# Patient Record
Sex: Female | Born: 1950 | Race: White | Hispanic: No | Marital: Single | Smoking: Never smoker
Health system: Southern US, Community
[De-identification: ages and names within clinical notes are randomized; demographics above are authoritative.]

## PROBLEM LIST (undated history)

## (undated) DIAGNOSIS — N200 Calculus of kidney: Secondary | ICD-10-CM

## (undated) HISTORY — PX: FOOT SURGERY: SHX648

---

## 2017-01-26 ENCOUNTER — Emergency Department (HOSPITAL_BASED_OUTPATIENT_CLINIC_OR_DEPARTMENT_OTHER)
Admission: EM | Admit: 2017-01-26 | Discharge: 2017-01-26 | Disposition: A | Discharge status: Home / Self Care | Payer: BLUE CROSS/BLUE SHIELD | Attending: Emergency Medicine | Admitting: Emergency Medicine

## 2017-01-26 ENCOUNTER — Encounter (HOSPITAL_BASED_OUTPATIENT_CLINIC_OR_DEPARTMENT_OTHER): Payer: Self-pay | Admitting: *Deleted

## 2017-01-26 ENCOUNTER — Emergency Department (HOSPITAL_BASED_OUTPATIENT_CLINIC_OR_DEPARTMENT_OTHER): Payer: BLUE CROSS/BLUE SHIELD

## 2017-01-26 DIAGNOSIS — M545 Low back pain, unspecified: Secondary | ICD-10-CM

## 2017-01-26 DIAGNOSIS — M791 Myalgia: Secondary | ICD-10-CM | POA: Diagnosis not present

## 2017-01-26 HISTORY — DX: Calculus of kidney: N20.0

## 2017-01-26 LAB — URINALYSIS, ROUTINE W REFLEX MICROSCOPIC
Bilirubin Urine: NEGATIVE
Glucose, UA: NEGATIVE mg/dL
Hgb urine dipstick: NEGATIVE
Ketones, ur: NEGATIVE mg/dL
Leukocytes, UA: NEGATIVE
Nitrite: NEGATIVE
Protein, ur: NEGATIVE mg/dL
Specific Gravity, Urine: 1.03 (ref 1.005–1.030)
pH: 6 (ref 5.0–8.0)

## 2017-01-26 MED ORDER — TRAMADOL HCL 50 MG PO TABS: 50.0000 mg | ORAL_TABLET | Freq: Four times a day (QID) | ORAL | 0 refills | Status: AC | PRN

## 2017-01-26 MED ORDER — CYCLOBENZAPRINE HCL 5 MG PO TABS: 5.0000 mg | ORAL_TABLET | Freq: Two times a day (BID) | ORAL | 0 refills | Status: AC | PRN

## 2017-01-26 NOTE — ED Triage Notes (Signed)
Pt amb to triage with slow steady gait in nad. Pt reports right sided low back pain x 2 months, no worse today, "I just can't take it anymore." pt has hx of renal stones, states this is not her renal stone pain.

## 2017-01-26 NOTE — ED Notes (Signed)
Patient transported to X-ray 

## 2017-01-26 NOTE — Discharge Instructions (Signed)
Medications: Flexeril, tramadol  Treatment: Take 1-2 Flexeril twice daily as needed for muscle pain and spasms. Take tramadol every 6 hours as needed for your pain. Use ice and moist heat 3-4 times daily alternating 20 minutes on, 20 minutes off. Attempt the back exercises as tolerated.  Follow-up: Please follow-up with Dr. Pearletha ForgeHudnall, a sports medicine doctor, for further evaluation and treatment of your back pain. Please return to emergency department if you develop any new or worsening symptoms including loss of bowel or bladder control, numbness in your groin area, or inability to walk.

## 2017-01-26 NOTE — ED Provider Notes (Signed)
[facility] Provider Note   CSN: [id number] Arrival date & time: 01/26/17  1531  By signing my name below, I, Ryan Long, attest that this documentation has been prepared under the direction and in the presence of Krishna Dancel M. Eaden Hettinger, PA-C. Electronically Signed: Garen Lah, Scribe. 01/26/2017. 6:34 PM.   History   Chief Complaint Chief Complaint  Patient presents with  . Back Pain    The history is provided by the patient. No language interpreter was used.    HPI Comments:  Nancy Dennis is a 66 y.o. female with a PMHx of Renal Calculi, who presents to the Emergency Department complaining of constant, severe, right-sided lower back pain onset two months ago. She qualifies the pain as "gnawing". She reports having foot surgery ten weeks ago that she normally wears a boot for, but she stopped wearing it two weeks ago. She notes the pain is centralized to the middle of her lower back shoots to her right side. She also states the pain exacerbates with movement and standing where as sitting slightly alleviates the pain. She has a h/o renal calculi with the last episode occurring one month ago but notes this pain is dissimilar to her pain at that time. No h/o CA. No h/o IV drug abuse. Pt denies fever, CP, numbness, bladder/bowel incontinence, urgency, frequency, hematuria, dysuria, difficulty urinating, neck pain, and any other associated symptoms at this time.    Past Medical History:  Diagnosis Date  . Kidney stone     There are no active problems to display for this patient.   Past Surgical History:  Procedure Laterality Date  . FOOT SURGERY      OB History    No data available       Home Medications    Prior to Admission medications   Medication Sig Start Date End Date Taking? Authorizing Provider  cyclobenzaprine (FLEXERIL) 5 MG tablet Take 1 tablet (5 mg total) by mouth 2 (two) times daily as needed for muscle spasms. 01/26/17   Emi Holes, PA-C  traMADol  (ULTRAM) 50 MG tablet Take 1 tablet (50 mg total) by mouth every 6 (six) hours as needed. 01/26/17   Emi Holes, PA-C    Family History History reviewed. No pertinent family history.  Social History Social History  Substance Use Topics  . Smoking status: Never Smoker  . Smokeless tobacco: Never Used  . Alcohol use Not on file     Allergies   Patient has no known allergies.   Review of Systems Review of Systems  Constitutional: Negative for chills and fever.  HENT: Negative for facial swelling and sore throat.   Respiratory: Negative for shortness of breath.   Cardiovascular: Negative for chest pain.  Gastrointestinal: Negative for abdominal pain, nausea and vomiting.  Genitourinary: Negative for difficulty urinating, dysuria, frequency, hematuria and urgency.       - bladder/bowel incontinence  Musculoskeletal: Positive for back pain and myalgias. Negative for neck pain.  Skin: Negative for rash and wound.  Neurological: Negative for numbness and headaches.  Psychiatric/Behavioral: The patient is not nervous/anxious.      Physical Exam Updated Vital Signs BP 155/92 (BP Location: Left Arm)   Pulse 74   Resp 20   LMP  (LMP Unknown) Comment: postmenopausal  SpO2 99%   Physical Exam  Constitutional: She appears well-developed and well-nourished. No distress.  HENT:  Head: Normocephalic and atraumatic.  Mouth/Throat: Oropharynx is clear and moist. No oropharyngeal exudate.  Eyes: Conjunctivae are  normal. Pupils are equal, round, and reactive to light. Right eye exhibits no discharge. Left eye exhibits no discharge. No scleral icterus.  Neck: Normal range of motion. Neck supple. No thyromegaly present.  Cardiovascular: Normal rate, regular rhythm, normal heart sounds and intact distal pulses.  Exam reveals no gallop and no friction rub.   No murmur heard. Pulmonary/Chest: Effort normal and breath sounds normal. No stridor. No respiratory distress. She has no wheezes.  She has no rales.  Abdominal: Soft. Bowel sounds are normal. She exhibits no distension. There is no tenderness. There is no rebound and no guarding.  Musculoskeletal: She exhibits no edema.  Right lumbar and SI joint tenderness. No midline cervical or thoracic tenderness.   Lymphadenopathy:    She has no cervical adenopathy.  Neurological: She is alert. Coordination normal.  5/5 strength to the lower extremities. Normal sensation.  Pt can heel raise and toe raise without difficulty.   Skin: Skin is warm and dry. No rash noted. She is not diaphoretic. No pallor.  Psychiatric: She has a normal mood and affect.  Nursing note and vitals reviewed.   ED Treatments / Results  DIAGNOSTIC STUDIES:  COORDINATION OF CARE:  6:30 PM Discussed treatment plan with pt at bedside including a back XR and [place] A/P and pt agreed to plan.  Labs (all labs ordered are listed, but only abnormal results are displayed) Labs Reviewed  URINALYSIS, ROUTINE W REFLEX MICROSCOPIC - Abnormal; Notable for the following:       Result Value   APPearance CLOUDY (*)    All other components within normal limits    EKG  EKG Interpretation None       Radiology Dg Lumbar Spine Complete  Result Date: 01/26/2017 CLINICAL DATA:  Right low back pain for 1 month radiating to the right hip. Fall 1 month prior. EXAM: LUMBAR SPINE - COMPLETE 4+ VIEW COMPARISON:  None. FINDINGS: This report assumes 5 non rib-bearing lumbar vertebrae. There is moderate dextrocurvature of the lumbar spine. There is diffuse osteopenia. Lumbar vertebral body heights are preserved, with no fracture. Moderate to severe degenerative disc disease throughout the visualized thoracolumbar spine, most prominent at L2-3 and L3-4. Minimal 3 mm anterolisthesis at L5-S1. There is 6 mm right lateral subluxation of L3 relative to L2 on the frontal view. Mild facet arthropathy bilaterally in the lower lumbar spine. No aggressive appearing focal osseous lesions.  There are clustered 7 mm and 5 mm stones in the lower left kidney. Abdominal aortic atherosclerosis. IMPRESSION: 1. No lumbar spine fracture. 2. Moderate dextrocurvature of the lumbar spine. 3. Moderate to severe degenerative disc disease throughout the visualized thoracolumbar spine. 4. Minimal 3 mm anterolisthesis at L5-S1 . 5. Mild facet arthropathy bilaterally in the lower lumbar spine. 6. Left lower nephrolithiasis. 7. Aortic atherosclerosis. Electronically Signed   By: Delbert Phenix M.D.   On: 01/26/2017 19:59   Dg Hip Unilat W Or Wo Pelvis 2-3 Views Right  Result Date: 01/26/2017 CLINICAL DATA:  Chronic right hip pain, radiating to the lumbar spine. Fell 1 month ago. Initial encounter. EXAM: DG HIP (WITH OR WITHOUT PELVIS) 2-3V RIGHT COMPARISON:  None. FINDINGS: There is no evidence of fracture or dislocation. Both femoral heads are seated normally within their respective acetabula. The proximal right femur appears intact. Mild degenerative change is noted at the lower lumbar spine. The sacroiliac joints are unremarkable in appearance. The visualized bowel gas pattern is grossly unremarkable in appearance. IMPRESSION: No evidence of fracture or dislocation. Electronically Signed  By: Roanna RaiderJeffery  Chang M.D.   On: 01/26/2017 19:57    Procedures Procedures (including critical care time)  Medications Ordered in ED Medications - No data to display   Initial Impression / Assessment and Plan / ED Course  I have reviewed the triage vital signs and the nursing notes.  Pertinent labs & imaging results that were available during my care of the patient were reviewed by me and considered in my medical decision making (see chart for details).     Patient with back pain. X-ray of the lumbar spine shows no fracture; moderate dextro curvature; moderate to severe DDD throughout visualized thoracolumbar spine; minimal 3 mm anterolisthesis at L5-S1; mild facet arthropathy bilaterally in the lower lumbar spine;  left lower nephrolithiasis; aortic atherosclerosis. R Hip x-ray negative. No neurological deficits and normal neuro exam.  Patient is ambulatory.  No loss of bowel or bladder control.  No concern for cauda equina.  No fever, night sweats, weight loss, h/o cancer, IVDA, no recent procedure to back. No urinary symptoms suggestive of UTI.  Discharge patient home with Flexeril and tramadol. Follow up to orthopedics. Supportive care and return precaution discussed. Patient understands and agrees with plan. Patient vitals stable throughout ED course and discharged in satisfactory condition. I discussed patient case with Dr. Fredderick PhenixBelfi who guided the patient's management and agrees with plan.   Final Clinical Impressions(s) / ED Diagnoses   Final diagnoses:  Acute right-sided low back pain without sciatica    New Prescriptions Discharge Medication List as of 01/26/2017  8:51 PM    START taking these medications   Details  cyclobenzaprine (FLEXERIL) 5 MG tablet Take 1 tablet (5 mg total) by mouth 2 (two) times daily as needed for muscle spasms., Starting Thu 01/26/2017, Print    traMADol (ULTRAM) 50 MG tablet Take 1 tablet (50 mg total) by mouth every 6 (six) hours as needed., Starting Thu 01/26/2017, Print       I personally performed the services described in this documentation, which was scribed in my presence. The recorded information has been reviewed and is accurate.    Emi Holeslexandra M Antoin Dargis, PA-C 01/27/17 1556    Rolan BuccoMelanie Belfi, MD 01/27/17 [id number]

## 2017-02-02 ENCOUNTER — Ambulatory Visit: Payer: BLUE CROSS/BLUE SHIELD | Admitting: Family Medicine

## 2017-02-03 ENCOUNTER — Encounter: Payer: Self-pay | Admitting: Family Medicine

## 2017-02-03 ENCOUNTER — Ambulatory Visit (INDEPENDENT_AMBULATORY_CARE_PROVIDER_SITE_OTHER): Payer: BLUE CROSS/BLUE SHIELD | Admitting: Family Medicine

## 2017-02-03 DIAGNOSIS — G8929 Other chronic pain: Secondary | ICD-10-CM

## 2017-02-03 DIAGNOSIS — M545 Low back pain: Secondary | ICD-10-CM

## 2017-02-03 MED ORDER — MELOXICAM 15 MG PO TABS: 15.0000 mg | ORAL_TABLET | Freq: Every day | ORAL | 2 refills | Status: AC

## 2017-02-03 NOTE — Patient Instructions (Signed)
You have facet arthropathy, spinal stenosis Ok to take tylenol for baseline pain relief (1-2 extra strength tabs 3x/day) A prednisone dose pack is a consideration. Start meloxicam 15mg  daily with food for pain and inflammation. Tramadol as needed for severe pain (no driving on this medicine). Stay as active as possible. Physical therapy has been shown to be helpful as well - do this and home exercises - avoid extension based exercises. Strengthening of low back muscles, abdominal musculature are key for long term pain relief. If not improving, will consider further imaging (MRI). Follow up with me in 1 month for reevaluation.

## 2017-02-08 ENCOUNTER — Telehealth: Payer: Self-pay | Admitting: Family Medicine

## 2017-02-08 DIAGNOSIS — M545 Low back pain, unspecified: Secondary | ICD-10-CM | POA: Insufficient documentation

## 2017-02-08 NOTE — Assessment & Plan Note (Signed)
independently reviewed radiographs.  Noted multilevel severe degenerative disc disease along with facet arthropathy.  Her level of pain that is worse with prolonged standing and extension suggest spinal stenosis and facet arthropathy are causes of her pain. Discussed options - she will start meloxicam with tylenol, tramadol as needed.  Start physical therapy for flexion based program.  Consider MRI if not improving.  F/u in 1 month.

## 2017-02-08 NOTE — Progress Notes (Signed)
PCP: Dr. Saverio DankerMichelle Dennis  Subjective:   HPI: Patient is a 66 y.o. female here for low back pain.  Patient reports she's had 2 months of low back pain on right side.   Pain level 3/10 level. No acute injury or truama. Gets up to 10/10 with prolonged standing. Worse when wearing boot she had for bunionectomy in November. Tried flexeril, tramadol. Worse with extension also. No radiation of pain. No numbness or tingling. No bowel/bladder dysfunction  Past Medical History:  Diagnosis Date  . Kidney stone     Current Outpatient Prescriptions on File Prior to Visit  Medication Sig Dispense Refill  . cyclobenzaprine (FLEXERIL) 5 MG tablet Take 1 tablet (5 mg total) by mouth 2 (two) times daily as needed for muscle spasms. 20 tablet 0  . traMADol (ULTRAM) 50 MG tablet Take 1 tablet (50 mg total) by mouth every 6 (six) hours as needed. 15 tablet 0   No current facility-administered medications on file prior to visit.     Past Surgical History:  Procedure Laterality Date  . FOOT SURGERY      No Known Allergies  Social History   Social History  . Marital status: Single    Spouse name: N/A  . Number of children: N/A  . Years of education: N/A   Occupational History  . Not on file.   Social History Main Topics  . Smoking status: Never Smoker  . Smokeless tobacco: Never Used  . Alcohol use Not on file  . Drug use: Unknown  . Sexual activity: Not on file   Other Topics Concern  . Not on file   Social History Narrative  . No narrative on file    No family history on file.  BP (!) 148/95   Pulse 84   Ht 5\' 5"  (1.651 m)   Wt 195 lb (88.5 kg)   LMP  (LMP Unknown) Comment: postmenopausal  BMI 32.45 kg/m   Review of Systems: See HPI above.     Objective:  Physical Exam:  Gen: NAD, comfortable in exam room  Back: No gross deformity, scoliosis. No TTP.  No midline or bony TTP. FROM with pain on extension. Strength LEs 5/5 all muscle groups.   2+ MSRs in  patellar and achilles tendons, equal bilaterally. Negative SLRs. Sensation intact to light touch bilaterally. Negative logroll bilateral hips Negative fabers and piriformis stretches.   Assessment & Plan:  1. Low back pain - independently reviewed radiographs.  Noted multilevel severe degenerative disc disease along with facet arthropathy.  Her level of pain that is worse with prolonged standing and extension suggest spinal stenosis and facet arthropathy are causes of her pain. Discussed options - she will start meloxicam with tylenol, tramadol as needed.  Start physical therapy for flexion based program.  Consider MRI if not improving.  F/u in 1 month.

## 2017-02-09 NOTE — Addendum Note (Signed)
Addended by: Kathi SimpersWISE, Athalee Esterline F on: 02/09/2017 10:47 AM   Modules accepted: Orders

## 2017-02-09 NOTE — Telephone Encounter (Signed)
Order faxed.

## 2017-02-09 NOTE — Telephone Encounter (Signed)
She called again wanting to know when order for PT would be sent, thanks

## 2017-02-17 ENCOUNTER — Telehealth: Payer: Self-pay | Admitting: Family Medicine

## 2017-02-17 NOTE — Telephone Encounter (Signed)
Received long term disability paperwork for patient.  We had not discussed impairment, disability related to her back.  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  Given issues with her back, started physical therapy recently, I believe she should be on restrictions from lifting, pushing, pulling, limited standing/walking for next month with plans to see her back at that time.

## 2017-02-20 ENCOUNTER — Encounter: Payer: Self-pay | Admitting: *Deleted

## 2018-10-06 IMAGING — CR DG LUMBAR SPINE COMPLETE 4+V
5 series · 5 of 5 positions shown · non-contrast
Comparison: None.

CLINICAL DATA: Right low back pain for 1 month radiating to the
right hip. Fall 1 month prior.

EXAM:
LUMBAR SPINE - COMPLETE 4+ VIEW

[t l-spine a.p.]
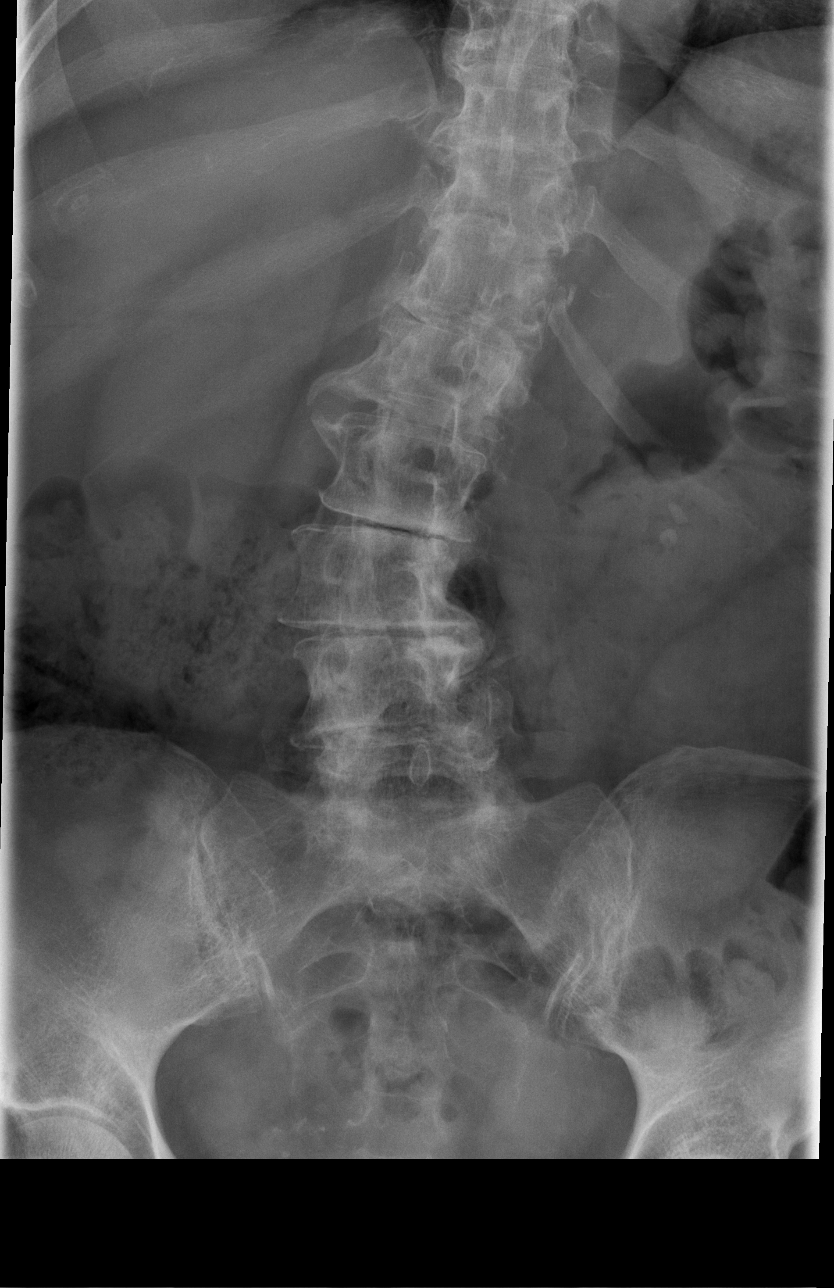

[t l-spine oblique exposure (1 of 2)]
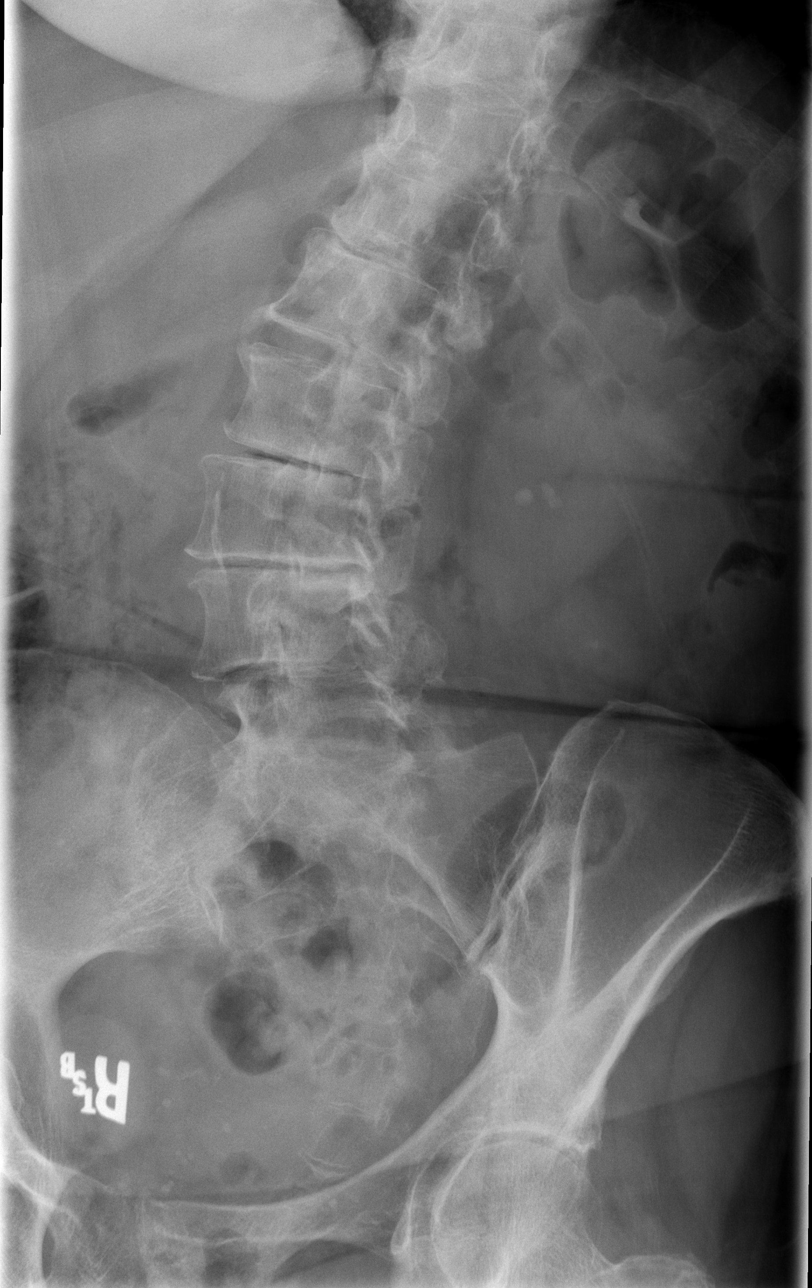

[t l-spine oblique exposure (2 of 2)]
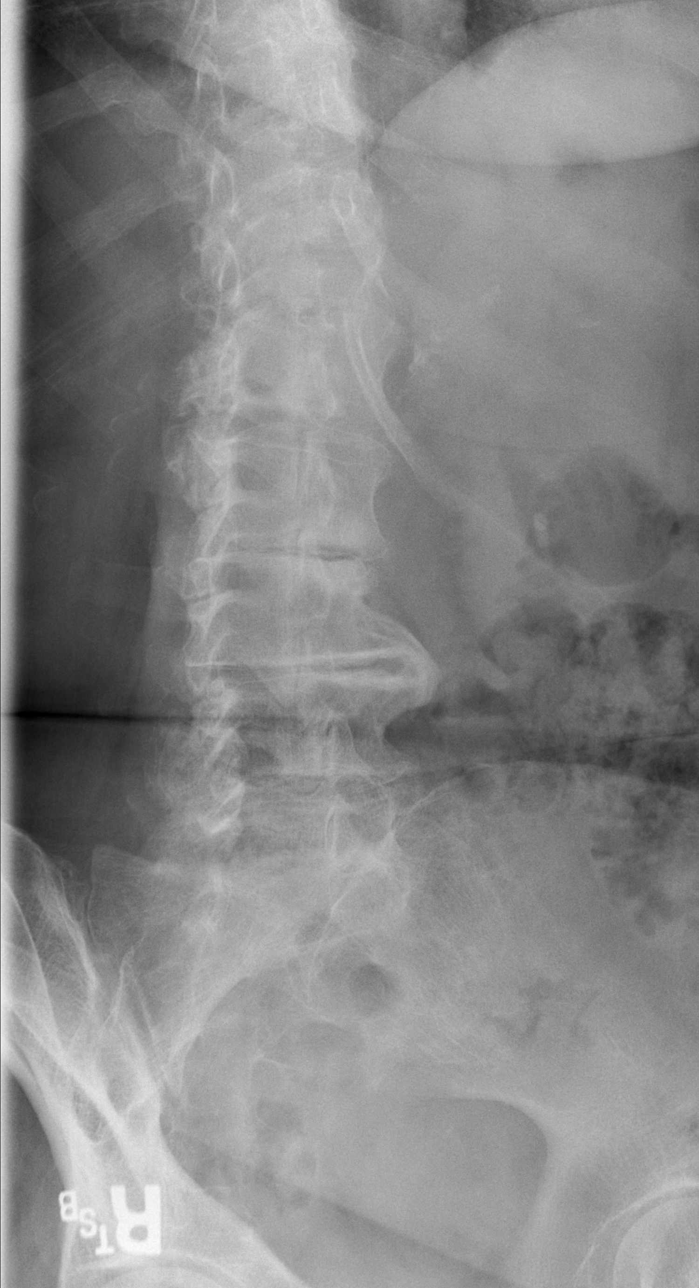

[t l-spine lat]
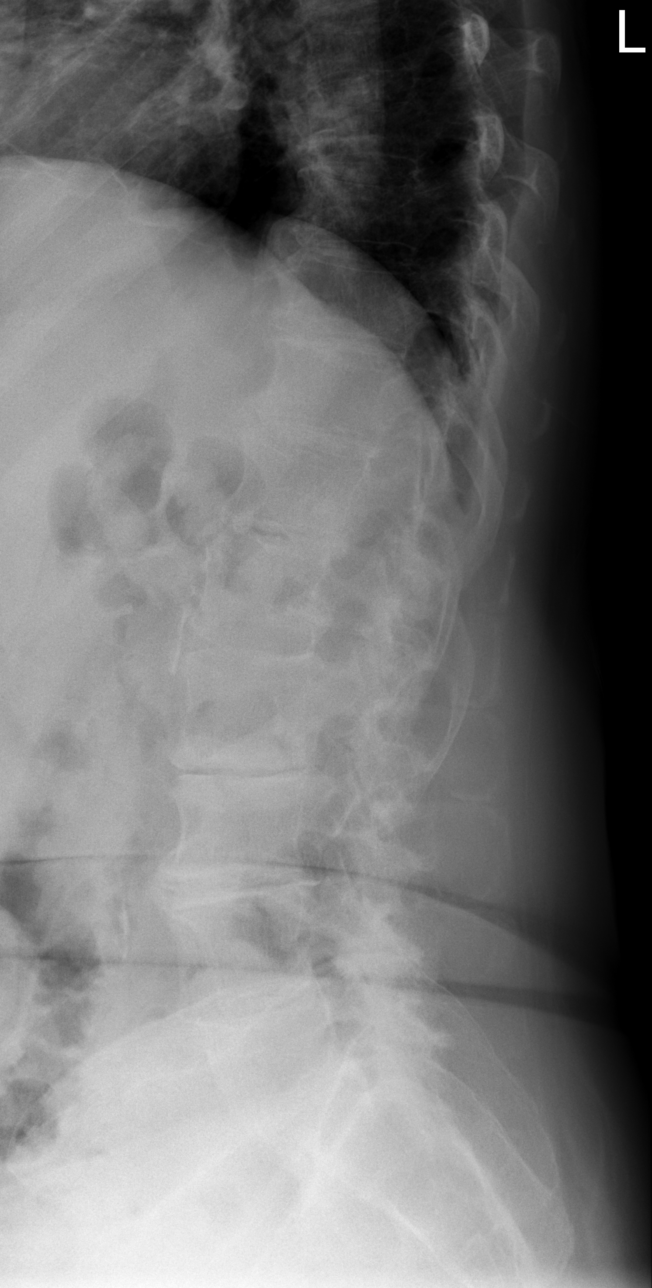

[t l-spine l5-s1 spot]
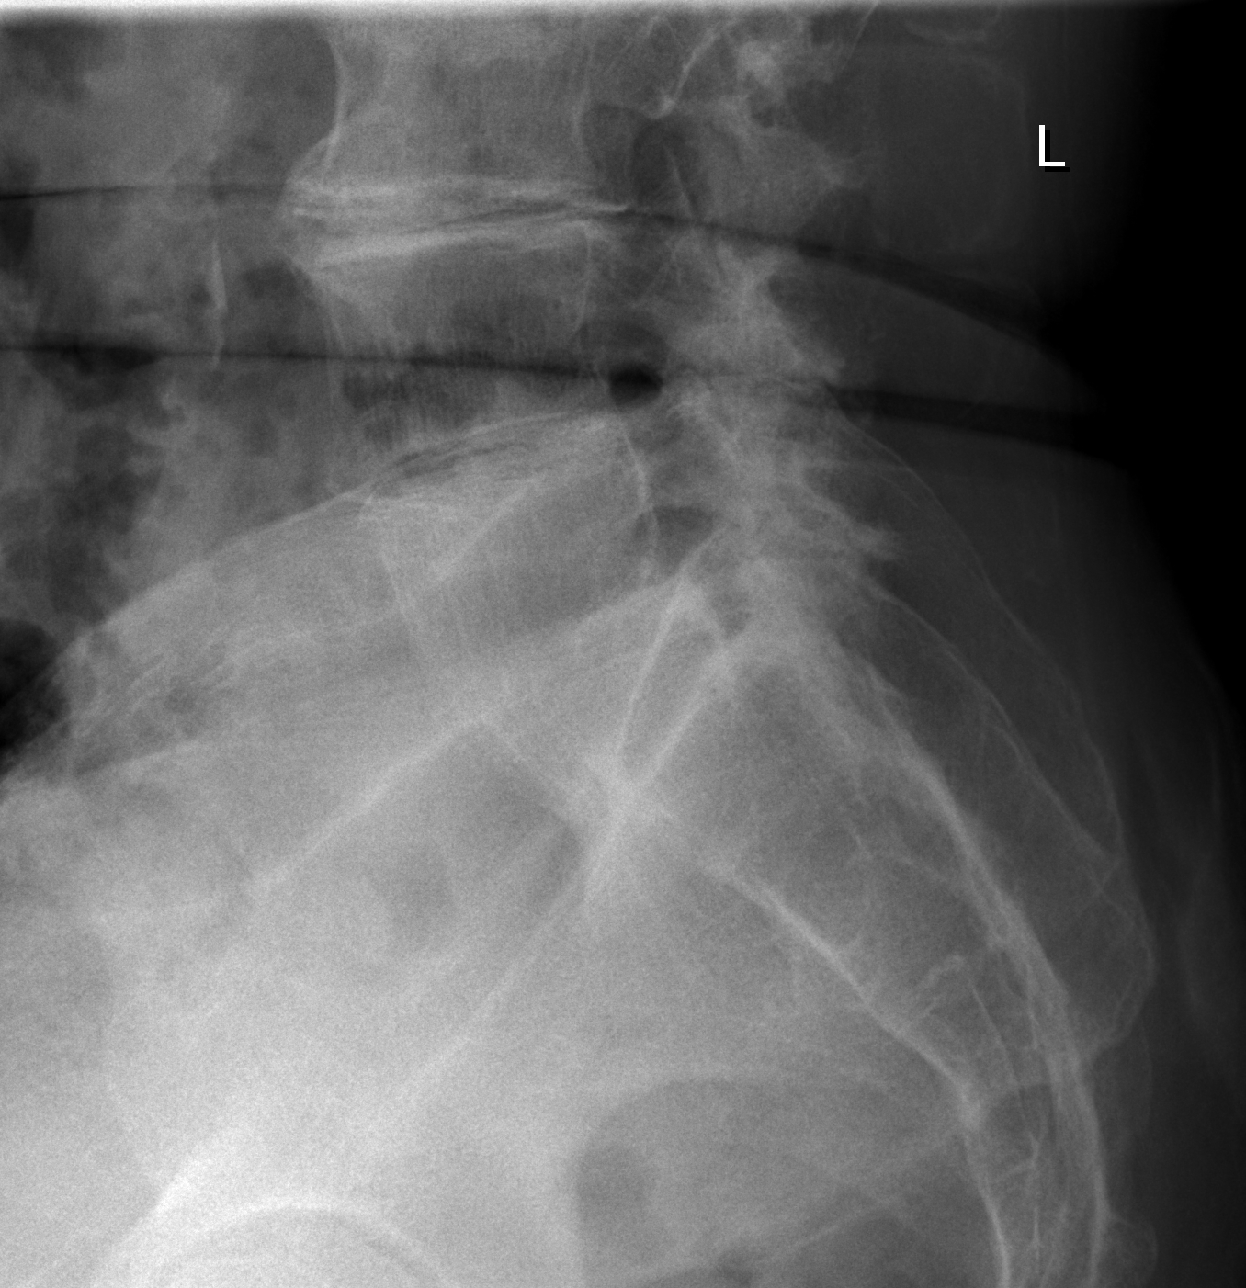

[5 of 5 positions shown; findings below may reference images not displayed]

FINDINGS: This report assumes 5 non rib-bearing lumbar vertebrae. There is
moderate dextrocurvature of the lumbar spine. There is diffuse
osteopenia.

Lumbar vertebral body heights are preserved, with no fracture.

Moderate to severe degenerative disc disease throughout the
visualized thoracolumbar spine, most prominent at L2-3 and L3-4.
Minimal 3 mm anterolisthesis at L5-S1. There is 6 mm right lateral
subluxation of L3 relative to L2 on the frontal view. Mild facet
arthropathy bilaterally in the lower lumbar spine. No aggressive
appearing focal osseous lesions. There are clustered 7 mm and 5 mm
stones in the lower left kidney. Abdominal aortic atherosclerosis.
IMPRESSION: 1. No lumbar spine fracture.
2. Moderate dextrocurvature of the lumbar spine.
3. Moderate to severe degenerative disc disease throughout the
visualized thoracolumbar spine.
4. Minimal 3 mm anterolisthesis at L5-S1 .
5. Mild facet arthropathy bilaterally in the lower lumbar spine.
6. Left lower nephrolithiasis.
7. Aortic atherosclerosis.

## 2024-05-15 ENCOUNTER — Encounter: Payer: Self-pay | Admitting: Obstetrics and Gynecology

## 2024-05-17 ENCOUNTER — Other Ambulatory Visit: Payer: Self-pay | Admitting: Obstetrics and Gynecology

## 2024-05-17 DIAGNOSIS — R102 Pelvic and perineal pain: Secondary | ICD-10-CM

## 2024-05-28 ENCOUNTER — Inpatient Hospital Stay: Admission: RE | Admit: 2024-05-28 | Source: Ambulatory Visit

## 2024-08-16 ENCOUNTER — Ambulatory Visit: Admitting: Obstetrics and Gynecology
